# Patient Record
Sex: Male | Born: 1995 | Race: White | Hispanic: No | Marital: Single | Smoking: Current every day smoker
Health system: Southern US, Community
[De-identification: ages and names within clinical notes are randomized; demographics above are authoritative.]

## PROBLEM LIST (undated history)

## (undated) DIAGNOSIS — J984 Other disorders of lung: Secondary | ICD-10-CM

## (undated) DIAGNOSIS — Q249 Congenital malformation of heart, unspecified: Secondary | ICD-10-CM

## (undated) HISTORY — PX: ADENOIDECTOMY: SUR15

## (undated) HISTORY — PX: OTHER SURGICAL HISTORY: SHX169

## (undated) HISTORY — PX: TONSILLECTOMY: SUR1361

---

## 2016-02-27 ENCOUNTER — Encounter (HOSPITAL_COMMUNITY): Payer: Self-pay | Admitting: Emergency Medicine

## 2016-02-27 ENCOUNTER — Emergency Department (HOSPITAL_COMMUNITY)
Admission: EM | Admit: 2016-02-27 | Discharge: 2016-02-27 | Disposition: A | Discharge status: Home / Self Care | Payer: Medicaid Other | Attending: Emergency Medicine | Admitting: Emergency Medicine

## 2016-02-27 ENCOUNTER — Emergency Department (HOSPITAL_COMMUNITY): Payer: Medicaid Other

## 2016-02-27 DIAGNOSIS — R112 Nausea with vomiting, unspecified: Secondary | ICD-10-CM | POA: Insufficient documentation

## 2016-02-27 DIAGNOSIS — R1010 Upper abdominal pain, unspecified: Secondary | ICD-10-CM | POA: Diagnosis present

## 2016-02-27 DIAGNOSIS — R197 Diarrhea, unspecified: Secondary | ICD-10-CM | POA: Diagnosis not present

## 2016-02-27 DIAGNOSIS — R1013 Epigastric pain: Secondary | ICD-10-CM | POA: Insufficient documentation

## 2016-02-27 DIAGNOSIS — Z87891 Personal history of nicotine dependence: Secondary | ICD-10-CM | POA: Insufficient documentation

## 2016-02-27 LAB — CBC WITH DIFFERENTIAL/PLATELET
BASOS PCT: 0 %
Basophils Absolute: 0 10*3/uL (ref 0.0–0.1)
EOS ABS: 0 10*3/uL (ref 0.0–0.7)
Eosinophils Relative: 0 %
HCT: 46 % (ref 39.0–52.0)
Hemoglobin: 16 g/dL (ref 13.0–17.0)
Lymphocytes Relative: 6 %
Lymphs Abs: 1 10*3/uL (ref 0.7–4.0)
MCH: 29.7 pg (ref 26.0–34.0)
MCHC: 34.8 g/dL (ref 30.0–36.0)
MCV: 85.5 fL (ref 78.0–100.0)
MONO ABS: 0.7 10*3/uL (ref 0.1–1.0)
MONOS PCT: 4 %
NEUTROS PCT: 90 %
Neutro Abs: 14.4 10*3/uL — ABNORMAL HIGH (ref 1.7–7.7)
Platelets: 277 10*3/uL (ref 150–400)
RBC: 5.38 MIL/uL (ref 4.22–5.81)
RDW: 13.2 % (ref 11.5–15.5)
WBC: 16.2 10*3/uL — ABNORMAL HIGH (ref 4.0–10.5)

## 2016-02-27 LAB — URINALYSIS, ROUTINE W REFLEX MICROSCOPIC
BILIRUBIN URINE: NEGATIVE
Glucose, UA: NEGATIVE mg/dL
HGB URINE DIPSTICK: NEGATIVE
Ketones, ur: >80 mg/dL — AB
Leukocytes, UA: NEGATIVE
Nitrite: NEGATIVE
PROTEIN: 100 mg/dL — AB
SPECIFIC GRAVITY, URINE: 1.01 (ref 1.005–1.030)
pH: 8.5 — ABNORMAL HIGH (ref 5.0–8.0)

## 2016-02-27 LAB — BASIC METABOLIC PANEL
Anion gap: 10 (ref 5–15)
BUN: 15 mg/dL (ref 6–20)
CALCIUM: 9.8 mg/dL (ref 8.9–10.3)
CO2: 28 mmol/L (ref 22–32)
CREATININE: 0.93 mg/dL (ref 0.61–1.24)
Chloride: 99 mmol/L — ABNORMAL LOW (ref 101–111)
Glucose, Bld: 121 mg/dL — ABNORMAL HIGH (ref 65–99)
Potassium: 3.3 mmol/L — ABNORMAL LOW (ref 3.5–5.1)
SODIUM: 137 mmol/L (ref 135–145)

## 2016-02-27 LAB — LIPASE, BLOOD: LIPASE: 20 U/L (ref 11–51)

## 2016-02-27 LAB — HEPATIC FUNCTION PANEL
ALK PHOS: 105 U/L (ref 38–126)
ALT: 27 U/L (ref 17–63)
AST: 30 U/L (ref 15–41)
Albumin: 5.3 g/dL — ABNORMAL HIGH (ref 3.5–5.0)
BILIRUBIN INDIRECT: 1.1 mg/dL — AB (ref 0.3–0.9)
Bilirubin, Direct: 0.2 mg/dL (ref 0.1–0.5)
TOTAL PROTEIN: 8 g/dL (ref 6.5–8.1)
Total Bilirubin: 1.3 mg/dL — ABNORMAL HIGH (ref 0.3–1.2)

## 2016-02-27 LAB — URINE MICROSCOPIC-ADD ON: RBC / HPF: NONE SEEN RBC/hpf (ref 0–5)

## 2016-02-27 MED ORDER — ONDANSETRON HCL 4 MG PO TABS: 4.0000 mg | ORAL_TABLET | Freq: Three times a day (TID) | ORAL | 0 refills | Status: DC | PRN

## 2016-02-27 MED ORDER — FAMOTIDINE IN NACL 20-0.9 MG/50ML-% IV SOLN
20.0000 mg | Freq: Once | INTRAVENOUS | Status: AC
  Administered 2016-02-27: 20 mg via INTRAVENOUS
  Filled 2016-02-27: qty 50

## 2016-02-27 MED ORDER — ONDANSETRON HCL 4 MG/2ML IJ SOLN
4.0000 mg | INTRAMUSCULAR | Status: DC | PRN
  Administered 2016-02-27: 4 mg via INTRAVENOUS
  Filled 2016-02-27: qty 2

## 2016-02-27 MED ORDER — GI COCKTAIL ~~LOC~~
30.0000 mL | Freq: Once | ORAL | Status: AC
  Administered 2016-02-27: 30 mL via ORAL
  Filled 2016-02-27: qty 30

## 2016-02-27 MED ORDER — MORPHINE SULFATE (PF) 4 MG/ML IV SOLN
4.0000 mg | INTRAVENOUS | Status: DC | PRN
  Administered 2016-02-27: 4 mg via INTRAVENOUS
  Filled 2016-02-27: qty 1

## 2016-02-27 NOTE — Discharge Instructions (Signed)
Eat a bland diet, avoiding greasy, fatty, fried foods, as well as spicy and acidic foods or beverages.  Avoid eating within the hour or 2 before going to bed or laying down.  Also avoid teas, colas, coffee, chocolate, pepermint and spearment.  Take over the counter pepcid, one tablet by mouth twice a day, for the next 3 to 4 weeks.  May also take over the counter maalox/mylanta, as directed on packaging, as needed for discomfort.  Take the prescription as directed.  Call your regular medical doctor tomorrow to schedule a follow up appointment this week.  Call the GI doctor tomorrow to schedule a follow up appointment within the next week. Return to the Emergency Department immediately if worsening.

## 2016-02-27 NOTE — ED Provider Notes (Signed)
[facility]-EMERGENCY DEPT Provider Note   CSN: [id number] Arrival date & time: 02/27/16  1254     History   Chief Complaint Chief Complaint  Patient presents with  . Abdominal Pain    HPI Jason Boyer is a 20 y.o. male.  HPI Pt was seen at 1405.  Per pt, c/o gradual onset and persistence of constant upper abd "pain" for the past 2 days, worse since last night.  Has been associated with multiple intermittent episodes of N/V/D.  Describes the abd pain as "sharp."  States his symptoms worsened last night several hours after he laid down after he ate "pizza rolls and tater tots." Denies fevers, no back pain, no rash, no CP/SOB, no black or blood in stools or emesis.       History reviewed. No pertinent past medical history.  There are no active problems to display for this patient.   Past Surgical History:  Procedure Laterality Date  . ADENOIDECTOMY    . TONSILLECTOMY    . tubes in ears         Home Medications    Prior to Admission medications   Not on File    Family History History reviewed. No pertinent family history.  Social History Social History  Substance Use Topics  . Smoking status: Former Smoker    Types: E-cigarettes  . Smokeless tobacco: Never Used  . Alcohol use No     Allergies   Review of patient's allergies indicates no known allergies.   Review of Systems Review of Systems ROS: Statement: All systems negative except as marked or noted in the HPI; Constitutional: Negative for fever and chills. ; ; Eyes: Negative for eye pain, redness and discharge. ; ; ENMT: Negative for ear pain, hoarseness, nasal congestion, sinus pressure and sore throat. ; ; Cardiovascular: Negative for chest pain, palpitations, diaphoresis, dyspnea and peripheral edema. ; ; Respiratory: Negative for cough, wheezing and stridor. ; ; Gastrointestinal: +N/V/D, abd pain. Negative for blood in stool, hematemesis, jaundice and rectal bleeding. . ; ; Genitourinary: Negative for  dysuria, flank pain and hematuria. ; ; Musculoskeletal: Negative for back pain and neck pain. Negative for swelling and trauma.; ; Skin: Negative for pruritus, rash, abrasions, blisters, bruising and skin lesion.; ; Neuro: Negative for headache, lightheadedness and neck stiffness. Negative for weakness, altered level of consciousness, altered mental status, extremity weakness, paresthesias, involuntary movement, seizure and syncope.       Physical Exam Updated Vital Signs BP 127/68 (BP Location: Right Arm)   Pulse (!) 59   Temp 97.6 F (36.4 C) (Oral)   Resp 16   Ht 6' (1.829 m)   Wt 163 lb (73.9 kg)   SpO2 100%   BMI 22.11 kg/m   Physical Exam 1410: Physical examination:  Nursing notes reviewed; Vital signs and O2 SAT reviewed;  Constitutional: Well developed, Well nourished, Well hydrated, Uncomfortable appearing, crying; Head:  Normocephalic, atraumatic; Eyes: EOMI, PERRL, No scleral icterus; ENMT: Mouth and pharynx normal, Mucous membranes moist; Neck: Supple, Full range of motion, No lymphadenopathy; Cardiovascular: Regular rate and rhythm, No murmur, rub, or gallop; Respiratory: Breath sounds clear & equal bilaterally, No rales, rhonchi, wheezes.  Speaking full sentences with ease, Normal respiratory effort/excursion; Chest: Nontender, Movement normal; Abdomen: Soft, +mid-epigastric tenderness to palp. No rebound or guarding. Nondistended, Normal bowel sounds; Genitourinary: No CVA tenderness; Extremities: Pulses normal, No tenderness, No edema, No calf edema or asymmetry.; Neuro: AA&Ox3, Major CN grossly intact.  Speech clear. No gross focal motor or  sensory deficits in extremities.; Skin: Color normal, Warm, Dry.   ED Treatments / Results  Labs (all labs ordered are listed, but only abnormal results are displayed)   EKG  EKG Interpretation None       Radiology   Procedures Procedures (including critical care time)  Medications Ordered in ED Medications  ondansetron  (ZOFRAN) injection 4 mg (4 mg Intravenous Given 02/27/16 1415)  morphine 4 MG/ML injection 4 mg (4 mg Intravenous Given 02/27/16 1415)  famotidine (PEPCID) IVPB 20 mg premix (20 mg Intravenous New Bag/Given 02/27/16 1415)     Initial Impression / Assessment and Plan / ED Course  I have reviewed the triage vital signs and the nursing notes.  Pertinent labs & imaging results that were available during my care of the patient were reviewed by me and considered in my medical decision making (see chart for details).  MDM Reviewed: previous chart, nursing note and vitals Reviewed previous: labs Interpretation: labs, x-ray and ultrasound   Results for orders placed or performed during the hospital encounter of 02/27/16  CBC with Differential  Result Value Ref Range   WBC 16.2 (H) 4.0 - 10.5 K/uL   RBC 5.38 4.22 - 5.81 MIL/uL   Hemoglobin 16.0 13.0 - 17.0 g/dL   HCT 16.146.0 09.639.0 - 04.552.0 %   MCV 85.5 78.0 - 100.0 fL   MCH 29.7 26.0 - 34.0 pg   MCHC 34.8 30.0 - 36.0 g/dL   RDW 40.913.2 81.111.5 - 91.415.5 %   Platelets 277 150 - 400 K/uL   Neutrophils Relative % 90 %   Neutro Abs 14.4 (H) 1.7 - 7.7 K/uL   Lymphocytes Relative 6 %   Lymphs Abs 1.0 0.7 - 4.0 K/uL   Monocytes Relative 4 %   Monocytes Absolute 0.7 0.1 - 1.0 K/uL   Eosinophils Relative 0 %   Eosinophils Absolute 0.0 0.0 - 0.7 K/uL   Basophils Relative 0 %   Basophils Absolute 0.0 0.0 - 0.1 K/uL  Basic metabolic panel  Result Value Ref Range   Sodium 137 135 - 145 mmol/L   Potassium 3.3 (L) 3.5 - 5.1 mmol/L   Chloride 99 (L) 101 - 111 mmol/L   CO2 28 22 - 32 mmol/L   Glucose, Bld 121 (H) 65 - 99 mg/dL   BUN 15 6 - 20 mg/dL   Creatinine, Ser 7.820.93 0.61 - 1.24 mg/dL   Calcium 9.8 8.9 - 95.610.3 mg/dL   GFR calc non Af Amer >60 >60 mL/min   GFR calc Af Amer >60 >60 mL/min   Anion gap 10 5 - 15  Lipase, blood  Result Value Ref Range   Lipase 20 11 - 51 U/L  Urinalysis, Routine w reflex microscopic ([facility])  Result Value Ref Range     Color, Urine YELLOW YELLOW   APPearance CLEAR CLEAR   Specific Gravity, Urine 1.010 1.005 - 1.030   pH 8.5 (H) 5.0 - 8.0   Glucose, UA NEGATIVE NEGATIVE mg/dL   Hgb urine dipstick NEGATIVE NEGATIVE   Bilirubin Urine NEGATIVE NEGATIVE   Ketones, ur >80 (A) NEGATIVE mg/dL   Protein, ur 213100 (A) NEGATIVE mg/dL   Nitrite NEGATIVE NEGATIVE   Leukocytes, UA NEGATIVE NEGATIVE  Hepatic function panel  Result Value Ref Range   Total Protein 8.0 6.5 - 8.1 g/dL   Albumin 5.3 (H) 3.5 - 5.0 g/dL   AST 30 15 - 41 U/L   ALT 27 17 - 63 U/L   Alkaline Phosphatase  105 38 - 126 U/L   Total Bilirubin 1.3 (H) 0.3 - 1.2 mg/dL   Bilirubin, Direct 0.2 0.1 - 0.5 mg/dL   Indirect Bilirubin 1.1 (H) 0.3 - 0.9 mg/dL  Urine microscopic-add on  Result Value Ref Range   Squamous Epithelial / LPF 0-5 (A) NONE SEEN   WBC, UA 0-5 0 - 5 WBC/hpf   RBC / HPF NONE SEEN 0 - 5 RBC/hpf   Bacteria, UA RARE (A) NONE SEEN   US Abdomen Complete Result Date: 02/27/2016 CLINICAL DATA:  Abdominal pain EXAM: ABDOMEN ULTRASOUND COMPLETE COMPARISON:  None. FINDINGS: Gallbladder: No gallstones are noted within gallbladder. Borderline thickening of gallbladder wall up to 3 mm. Gallbladder sludge is noted. No sonographic Murphy's sign. Common bile duct: Diameter: 3 mm in diameter within normal limits. Liver: No focal lesion identified. Within normal limits in parenchymal echogenicity. IVC: No abnormality visualized. Pancreas: Visualized portion unremarkable. Main pancreatic duct measures 2 mm in diameter. Spleen: Size and appearance within normal limits. Measures 6.4 cm in length Right Kidney: Length: 11 cm. Echogenicity within normal limits. No mass or hydronephrosis visualized. Left Kidney: Length: 12 cm. Echogenicity within normal limits. No mass or hydronephrosis visualized. Abdominal aorta: No aneurysm visualized. Measures up to 2.2 cm in diameter. Other findings: The patient presented epigastric and right upper quadrant pain  throughout the examination IMPRESSION: 1. No shadowing gallstones are noted within gallbladder. Small gallbladder sludge. Borderline thickening of gallbladder wall up to 3 mm. No sonographic Murphy's sign. 2. Normal CBD. 3. No hydronephrosis. 4. No aortic aneurysm. Electronically Signed   By: Natasha Mead M.D.   On: 02/27/2016 15:36   Dg Abd Acute W/chest Result Date: 02/27/2016 CLINICAL DATA:  Abdominal pain. EXAM: DG ABDOMEN ACUTE W/ 1V CHEST COMPARISON:  No recent prior. FINDINGS: Mediastinum hilar structures normal. Tiny linear density in the right upper lung most likely a prominent small vessel or focal area of atelectasis and/or scarring. Lungs are clear of acute infiltrates. Heart size normal. No pleural effusion or pneumothorax. Soft tissue structures are unremarkable. No bowel distention or free air. IMPRESSION: No acute cardiopulmonary disease. No acute intra-abdominal abnormality. Electronically Signed   By: Maisie Fus  Register   On: 02/27/2016 14:39    1650:  Pt has tol PO well while in the ED without N/V.  No stooling while in the ED.  Abd benign, VSS. Feels better and wants to go home now. Tx symptomatically at this time. Dx and testing d/w pt.  Questions answered.  Verb understanding, agreeable to d/c home with outpt f/u.    Final Clinical Impressions(s) / ED Diagnoses   Final diagnoses:  None    New Prescriptions New Prescriptions   No medications on file     Samuel Jester, DO 03/01/16 [phone]

## 2016-02-27 NOTE — ED Triage Notes (Signed)
Patient complaining of upper middle abdominal pain x 2 days. Also complaining of vomiting starting last night. States he also had hematemesis.

## 2017-08-10 ENCOUNTER — Encounter (HOSPITAL_COMMUNITY): Payer: Self-pay | Admitting: Emergency Medicine

## 2017-08-10 ENCOUNTER — Emergency Department (HOSPITAL_COMMUNITY): Payer: Medicaid Other

## 2017-08-10 ENCOUNTER — Other Ambulatory Visit: Payer: Self-pay

## 2017-08-10 DIAGNOSIS — S67192A Crushing injury of right middle finger, initial encounter: Secondary | ICD-10-CM | POA: Diagnosis present

## 2017-08-10 DIAGNOSIS — F17210 Nicotine dependence, cigarettes, uncomplicated: Secondary | ICD-10-CM | POA: Insufficient documentation

## 2017-08-10 DIAGNOSIS — Y999 Unspecified external cause status: Secondary | ICD-10-CM | POA: Insufficient documentation

## 2017-08-10 DIAGNOSIS — S60131A Contusion of right middle finger with damage to nail, initial encounter: Secondary | ICD-10-CM | POA: Diagnosis not present

## 2017-08-10 DIAGNOSIS — W230XXA Caught, crushed, jammed, or pinched between moving objects, initial encounter: Secondary | ICD-10-CM | POA: Insufficient documentation

## 2017-08-10 DIAGNOSIS — Y929 Unspecified place or not applicable: Secondary | ICD-10-CM | POA: Insufficient documentation

## 2017-08-10 DIAGNOSIS — Z23 Encounter for immunization: Secondary | ICD-10-CM | POA: Diagnosis not present

## 2017-08-10 DIAGNOSIS — Y9389 Activity, other specified: Secondary | ICD-10-CM | POA: Insufficient documentation

## 2017-08-10 DIAGNOSIS — S61214A Laceration without foreign body of right ring finger without damage to nail, initial encounter: Secondary | ICD-10-CM | POA: Diagnosis not present

## 2017-08-10 NOTE — ED Triage Notes (Signed)
Middle and ring finger on rt hand got stuck in some gears at work

## 2017-08-11 ENCOUNTER — Emergency Department (HOSPITAL_COMMUNITY)
Admission: EM | Admit: 2017-08-11 | Discharge: 2017-08-11 | Disposition: A | Discharge status: Home / Self Care | Payer: Medicaid Other | Attending: Emergency Medicine | Admitting: Emergency Medicine

## 2017-08-11 DIAGNOSIS — S61214A Laceration without foreign body of right ring finger without damage to nail, initial encounter: Secondary | ICD-10-CM

## 2017-08-11 DIAGNOSIS — S6010XA Contusion of unspecified finger with damage to nail, initial encounter: Secondary | ICD-10-CM

## 2017-08-11 MED ORDER — IBUPROFEN 800 MG PO TABS: 800.0000 mg | ORAL_TABLET | Freq: Three times a day (TID) | ORAL | 0 refills | Status: AC

## 2017-08-11 MED ORDER — TETANUS-DIPHTH-ACELL PERTUSSIS 5-2.5-18.5 LF-MCG/0.5 IM SUSP
0.5000 mL | Freq: Once | INTRAMUSCULAR | Status: AC
  Administered 2017-08-11: 0.5 mL via INTRAMUSCULAR
  Filled 2017-08-11: qty 0.5

## 2017-08-11 MED ORDER — POVIDONE-IODINE 10 % EX SOLN
CUTANEOUS | Status: AC
  Administered 2017-08-11: 02:00:00
  Filled 2017-08-11: qty 15

## 2017-08-11 MED ORDER — POVIDONE-IODINE 10 % EX OINT
TOPICAL_OINTMENT | Freq: Once | CUTANEOUS | Status: DC
  Filled 2017-08-11: qty 28.35

## 2017-08-11 MED ORDER — SULFAMETHOXAZOLE-TRIMETHOPRIM 800-160 MG PO TABS
1.0000 | ORAL_TABLET | Freq: Once | ORAL | Status: AC
  Administered 2017-08-11: 1 via ORAL
  Filled 2017-08-11: qty 1

## 2017-08-11 MED ORDER — SULFAMETHOXAZOLE-TRIMETHOPRIM 800-160 MG PO TABS: 1.0000 | ORAL_TABLET | Freq: Two times a day (BID) | ORAL | 0 refills | Status: AC

## 2017-08-11 NOTE — ED Provider Notes (Signed)
[facility] EMERGENCY DEPARTMENT Provider Note   CSN: [id number] Arrival date & time: 08/10/17  2241     History   Chief Complaint Chief Complaint  Patient presents with  . Finger Injury    HPI Jason Boyer is a 22 y.o. male.  HPI   Jason Boyer is a 22 y.o. male who presents to the Emergency Department complaining of crush injury to the distal ends of his right middle and ring fingers.  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  He complains of tingling to his fingertips and throbbing pain of the distal end of the middle finger discoloration of the nail with laceration to the ring finger and abrasion fo the middle finger.  He has not taken any medications or tried any therapies prior to arrival.  Last tetanus is unknown.  He denies any pain to the proximal hand or wrist.   History reviewed. No pertinent past medical history.  There are no active problems to display for this patient.   Past Surgical History:  Procedure Laterality Date  . ADENOIDECTOMY    . TONSILLECTOMY    . tubes in ears          Home Medications    Prior to Admission medications   Medication Sig Start Date End Date Taking? Authorizing Provider  ondansetron (ZOFRAN) 4 MG tablet Take 1 tablet (4 mg total) by mouth every 8 (eight) hours as needed for nausea or vomiting. 02/27/16   Samuel Jester, DO  RaNITidine HCl (ZANTAC PO) Take 1 tablet by mouth once as needed (for heartburn/GERD symptoms).    [provider]    Family History No family history on file.  Social History Social History   Tobacco Use  . Smoking status: Current Every Day Smoker    Packs/day: 1.00    Types: Cigarettes  . Smokeless tobacco: Never Used  Substance Use Topics  . Alcohol use: No  . Drug use: Yes    Types: Marijuana     Allergies   Patient has no known allergies.   Review of Systems Review of Systems  Constitutional: Negative for chills and fever.   Musculoskeletal: Positive for arthralgias. Negative for joint swelling.  Skin: Positive for wound (abrasion right middle and laceration of ring finger). Negative for color change.  Neurological: Negative for weakness and numbness.  All other systems reviewed and are negative.    Physical Exam Updated Vital Signs BP (!) 149/92 (BP Location: Right Arm)   Pulse 79   Temp 98 F (36.7 C) (Oral)   Resp 18   Ht 6\' 2"  (1.88 m)   Wt 77.1 kg (170 lb)   SpO2 100%   BMI 21.83 kg/m   Physical Exam  Constitutional: He is oriented to person, place, and time. He appears well-developed and well-nourished. No distress.  HENT:  Head: Atraumatic.  Cardiovascular: Normal rate and intact distal pulses.  Pulmonary/Chest: Effort normal and breath sounds normal. No respiratory distress.  Musculoskeletal: Normal range of motion. He exhibits no edema or deformity.  Pt has full ROM of the PIP and DIP joint's of the right middle and ring fingers.  Small subungual hematoma right middle fingernail, mild bleeding along the epionychium  Neurological: He is alert and oriented to person, place, and time. No sensory deficit.  Skin: Skin is warm. Capillary refill takes less than 2 seconds.  Abrasion of the distal right middle finger.  Superficial laceration to the distal  right ring finger.  No edema.    Nursing note and vitals reviewed.    ED Treatments / Results  Labs (all labs ordered are listed, but only abnormal results are displayed) Labs Reviewed - No data to display  EKG None  Radiology Dg Hand Complete Right  Result Date: 08/10/2017 CLINICAL DATA:  Right third and fourth digits got stuck in gears while working on a car. Pain. EXAM: RIGHT HAND - COMPLETE 3+ VIEW COMPARISON:  None. FINDINGS: Tiny 1 x 2 mm ossific density off the radial aspect of the third proximal phalangeal head is suspicious for an age-indeterminate collateral ligament injury. Given mild soft tissue swelling at this level, suspect  this could be acute. No joint dislocation. Soft tissue debris over the dorsum of the fifth PIP joint. Carpal rows are maintained. The distal radius and ulna are unremarkable. Probable vascular phleboliths or soft tissue debris along the volar aspect of the distal forearm. IMPRESSION: Tiny 1 x 2 mm ossific density adjacent to the head of the third proximal phalanx along the radial aspect suspicious for tiny avulsion fracture. Electronically Signed   By: Tollie Ethavid  Kwon M.D.   On: 08/10/2017 23:32    Procedures Procedures (including critical care time)  Medications Ordered in ED Medications  povidone-iodine (BETADINE) 10 % external solution (has no administration in time range)  povidone-iodine (BETADINE) 10 % ointment (has no administration in time range)  Tdap (BOOSTRIX) injection 0.5 mL (has no administration in time range)  sulfamethoxazole-trimethoprim (BACTRIM DS,SEPTRA DS) 800-160 MG per tablet 1 tablet (has no administration in time range)     Initial Impression / Assessment and Plan / ED Course  I have reviewed the triage vital signs and the nursing notes.  Pertinent labs & imaging results that were available during my care of the patient were reviewed by me and considered in my medical decision making (see chart for details).     Patient does not have tenderness or injuries at the site of the third proximal phalanx.  I do not feel that this is clinically significant.  Wounds have been cleaned with saline and Betadine, Td updated.  Will apply finger splint and buddy tape fingers for support.  Laceration appears superficial and does not appear to need sutures.  Patient agrees to treatment plan with antibiotics and close follow-up with local orthopedics.  Wound care instructions and return precautions discussed.  Final Clinical Impressions(s) / ED Diagnoses   Final diagnoses:  Subungual hematoma of digit of hand, initial encounter  Laceration of right ring finger without foreign body  without damage to nail, initial encounter    ED Discharge Orders    None       Pauline Ausriplett, Lucca Greggs, PA-C 08/11/17 0150    Devoria AlbeKnapp, Iva, MD 08/11/17 [phone]

## 2017-08-11 NOTE — Discharge Instructions (Addendum)
Clean the wounds with mild soap and water.  Keep your fingers bandaged and buddy taped for 1 week.  Return to the ER for any signs of infection such as increasing pain, swelling or redness of your fingers fever or chills.  You have a bruise to the fingernail of your middle finger and will likely lose the nail, but will grow another one.  Avoid getting your right hand dirty until the wounds heal.  Call Dr. Mort SawyersHarrison's office to arrange a follow-up appointment in 3-4 days if the symptoms are not improving

## 2017-09-10 IMAGING — DX DG ABDOMEN ACUTE W/ 1V CHEST
3 series · 3 of 3 positions shown · non-contrast
Comparison: No recent prior.

CLINICAL DATA: Abdominal pain.

EXAM:
DG ABDOMEN ACUTE W/ 1V CHEST

[chest pa]
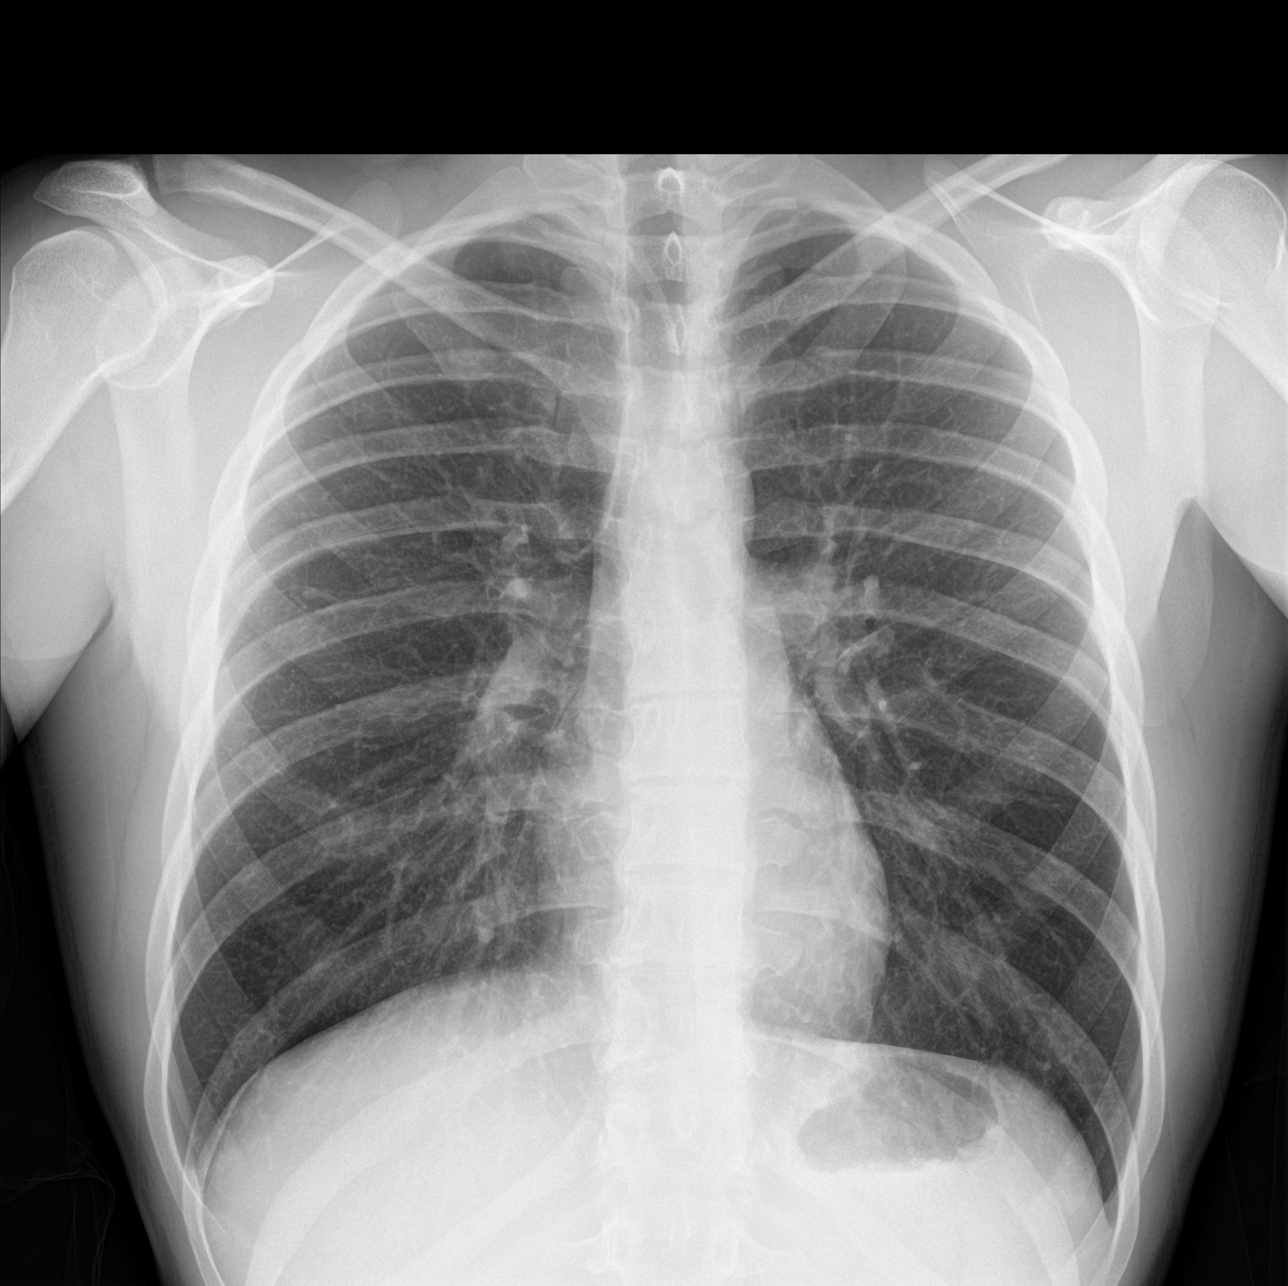

[abdomen erect]
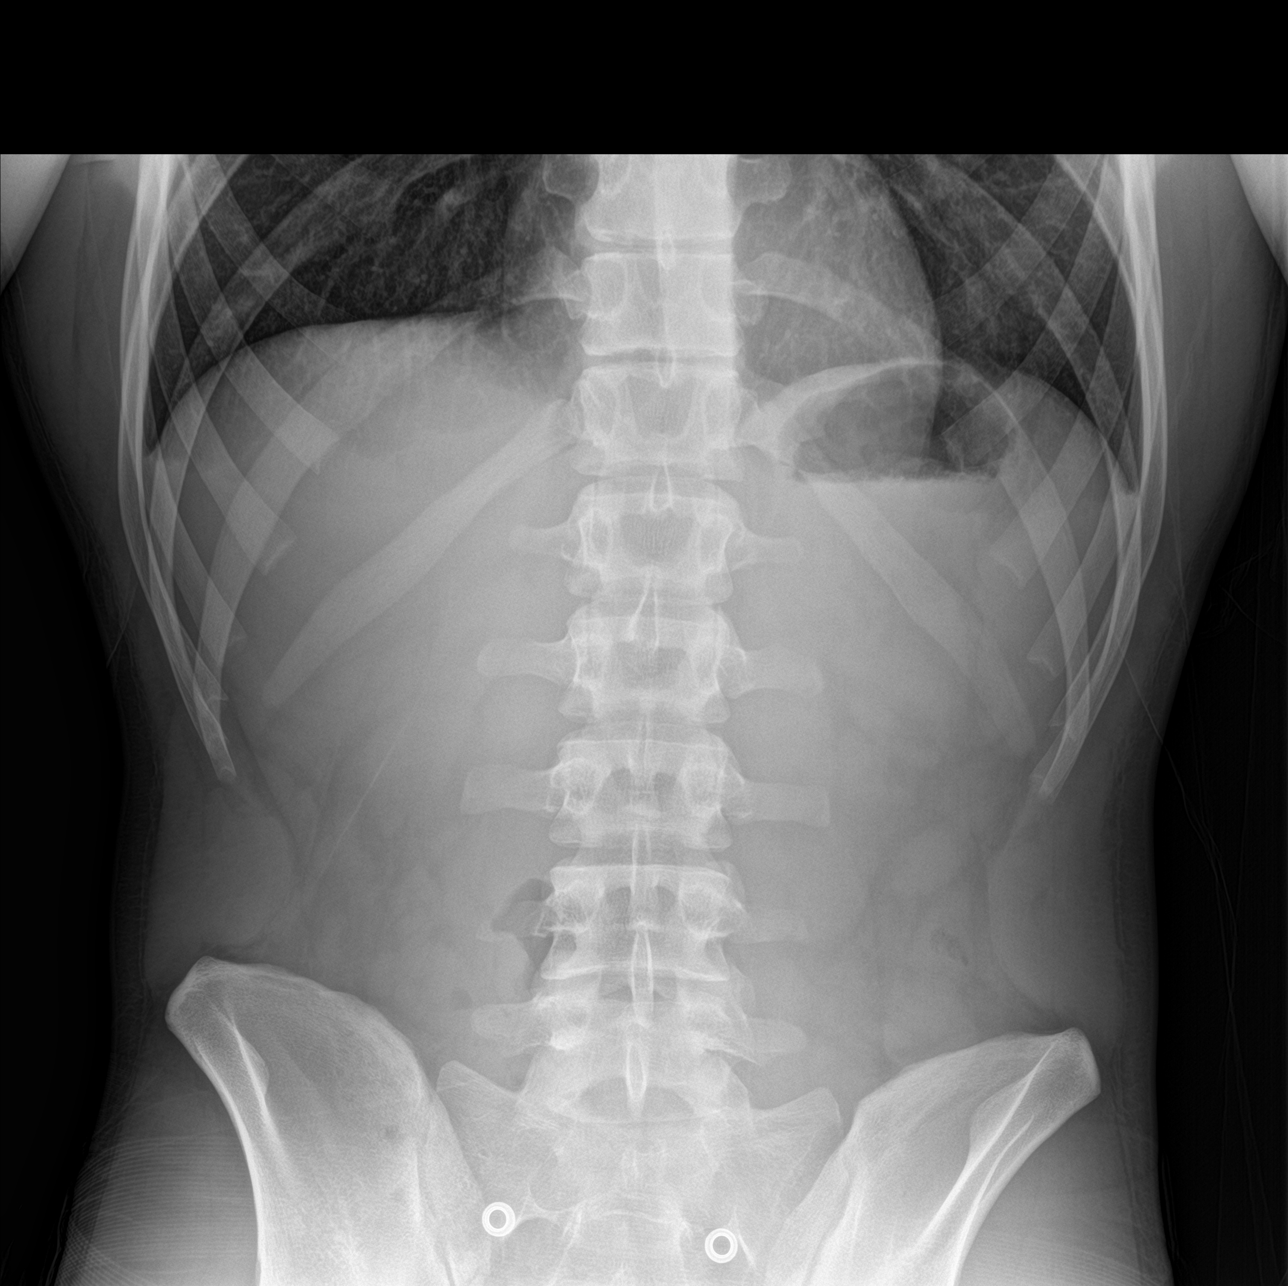

[abdomen supine]
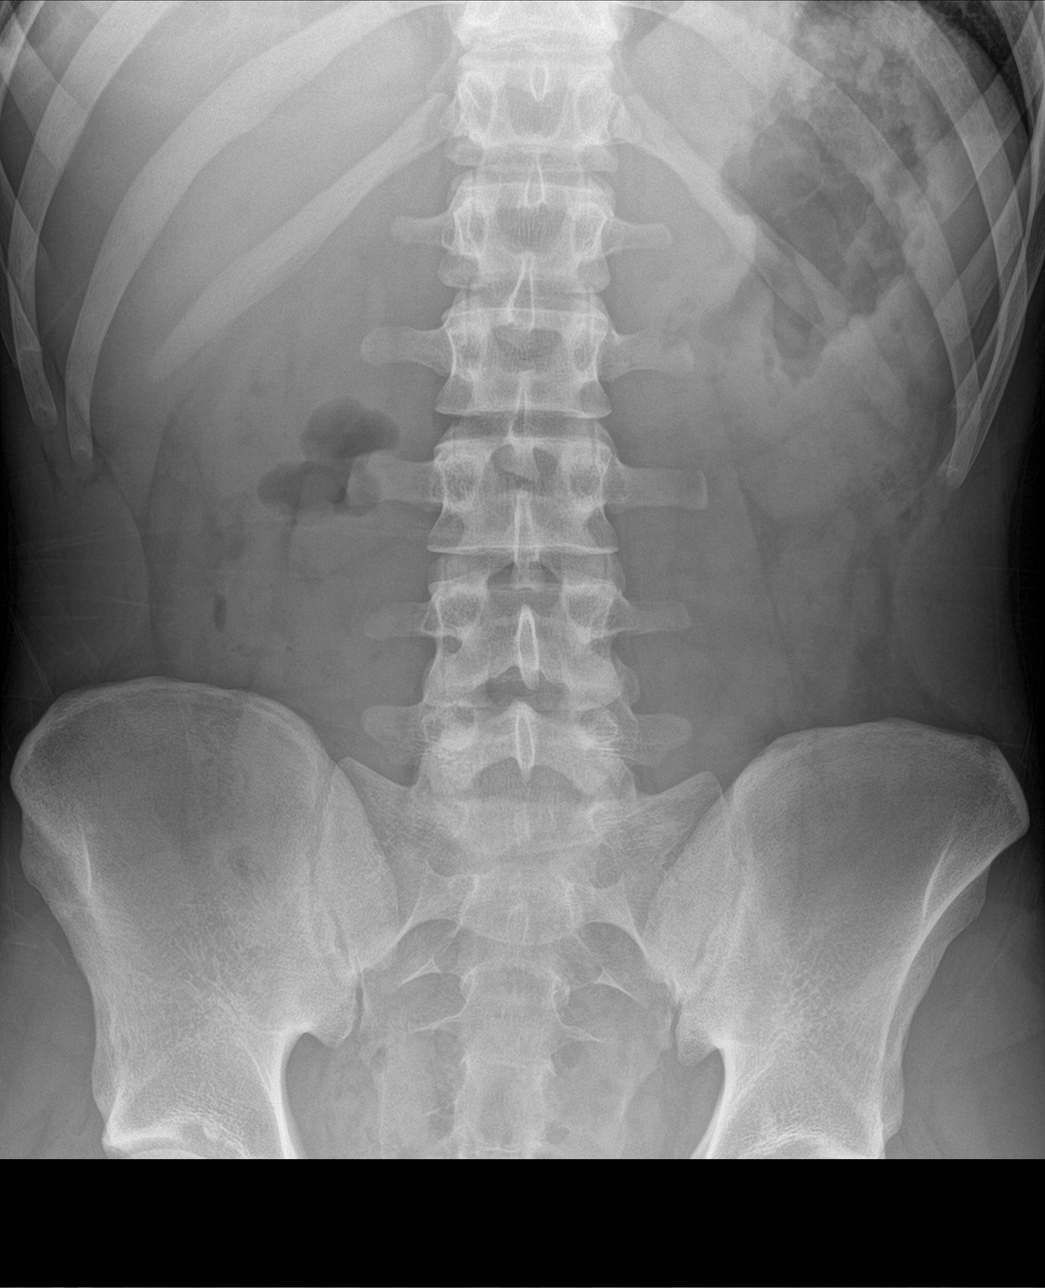

[3 of 3 positions shown; findings below may reference images not displayed]

FINDINGS: Mediastinum hilar structures normal. Tiny linear density in the
right upper lung most likely a prominent small vessel or focal area
of atelectasis and/or scarring. Lungs are clear of acute
infiltrates. Heart size normal. No pleural effusion or pneumothorax.
Soft tissue structures are unremarkable. No bowel distention or free
air.
IMPRESSION: No acute cardiopulmonary disease. No acute intra-abdominal
abnormality.

## 2018-05-09 IMAGING — US US ABDOMEN COMPLETE
1 series · 13 of 25 positions shown · non-contrast
Comparison: None.

CLINICAL DATA: Abdominal pain

EXAM:
ABDOMEN ULTRASOUND COMPLETE

[Series 1: us abdomen complete · 0.16mm/px · 13 of 106 slices shown]
[im 1/106]
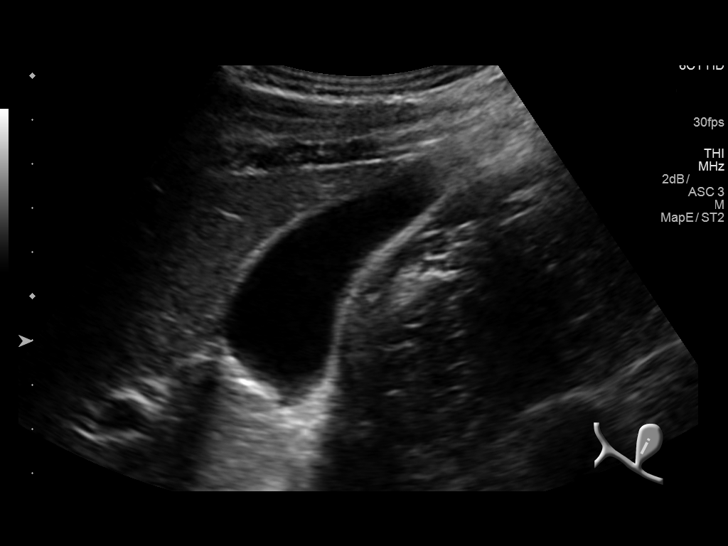
[im 9/106]
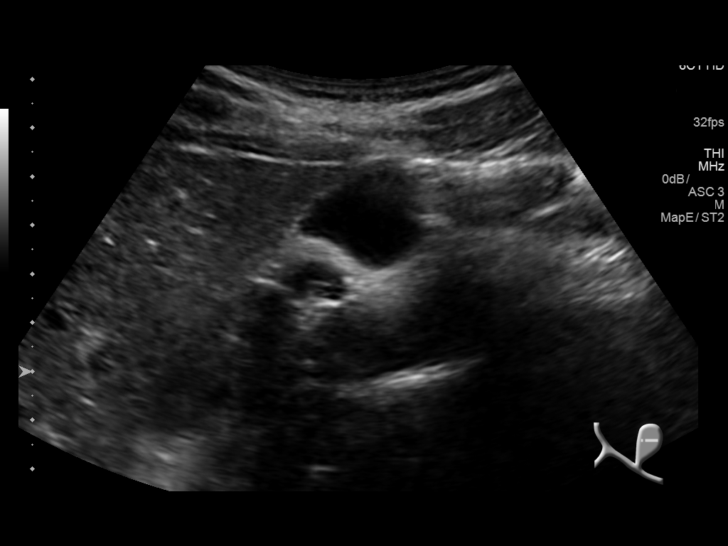
[im 18/106]
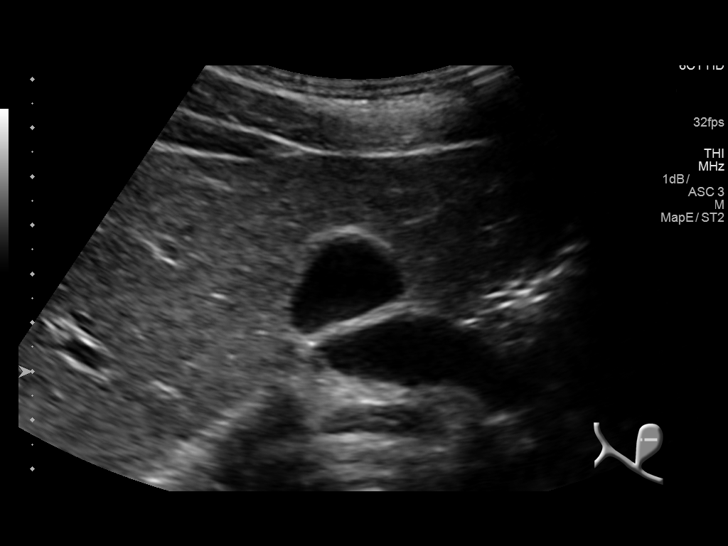
[im 27/106]
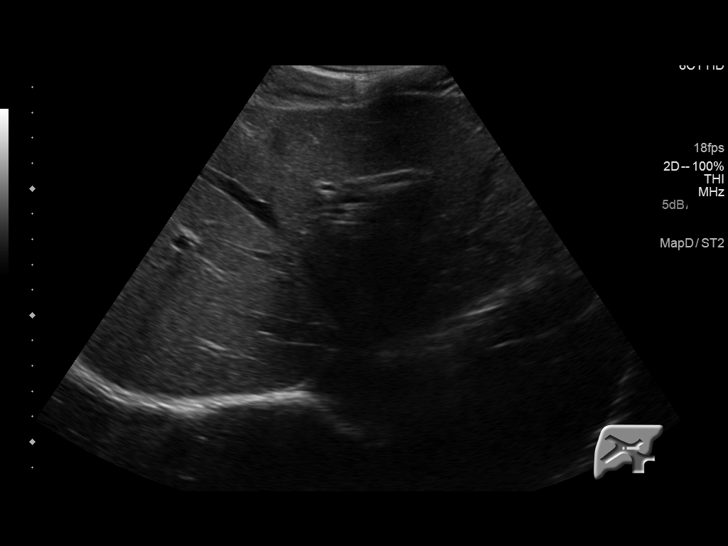
[im 36/106]
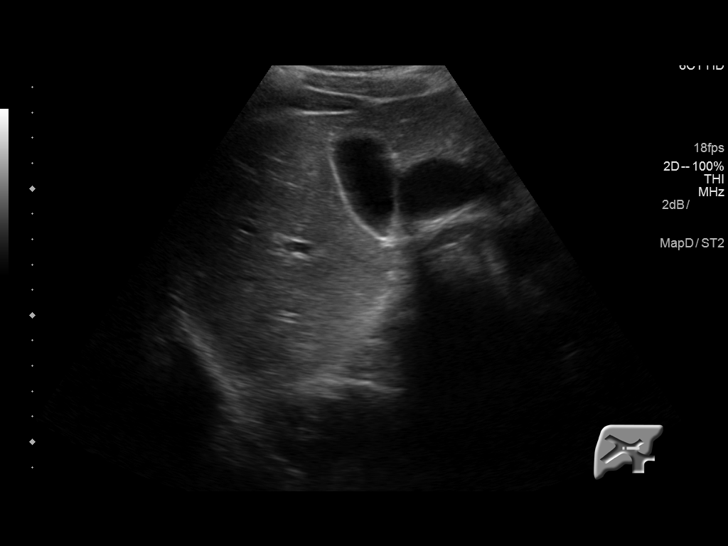
[im 44/106]
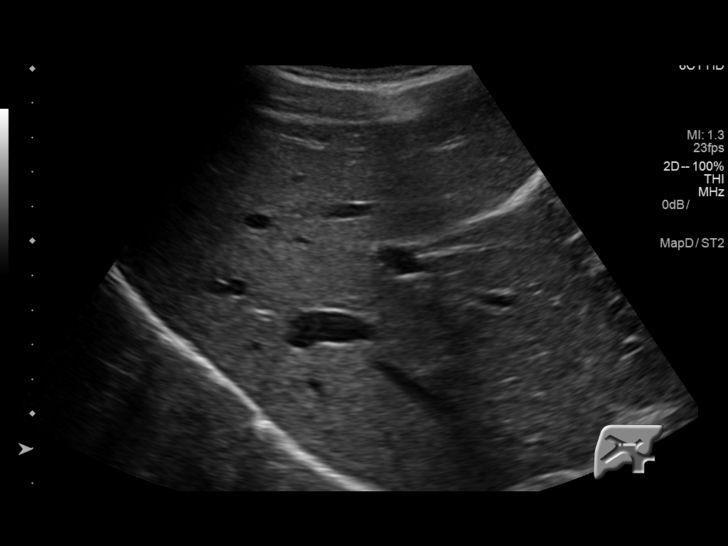
[im 53/106]
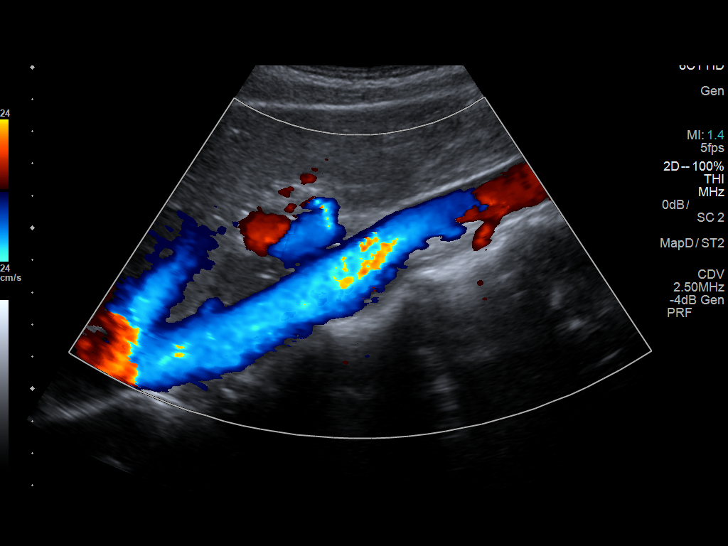
[im 62/106]
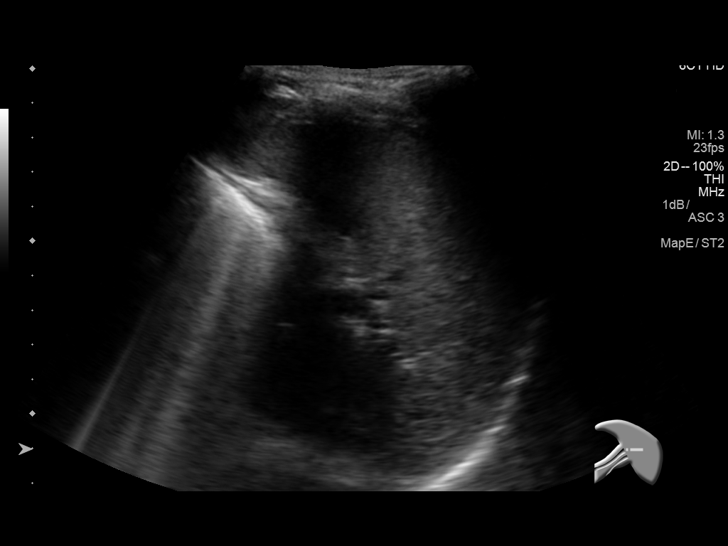
[im 71/106]
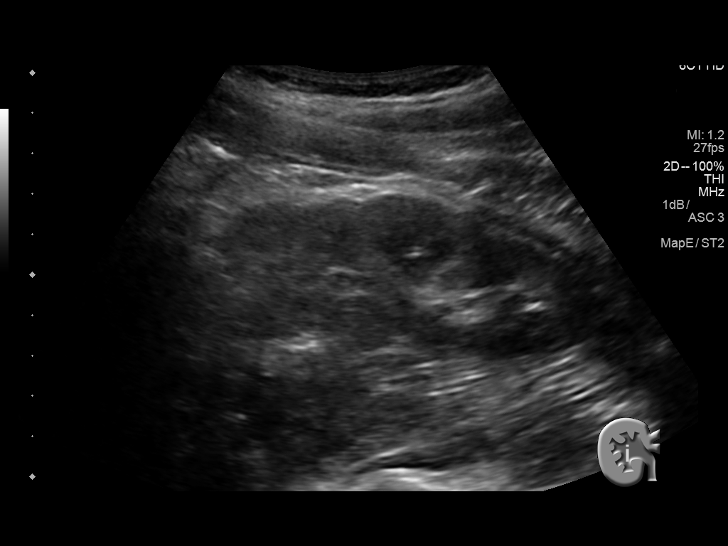
[im 79/106]
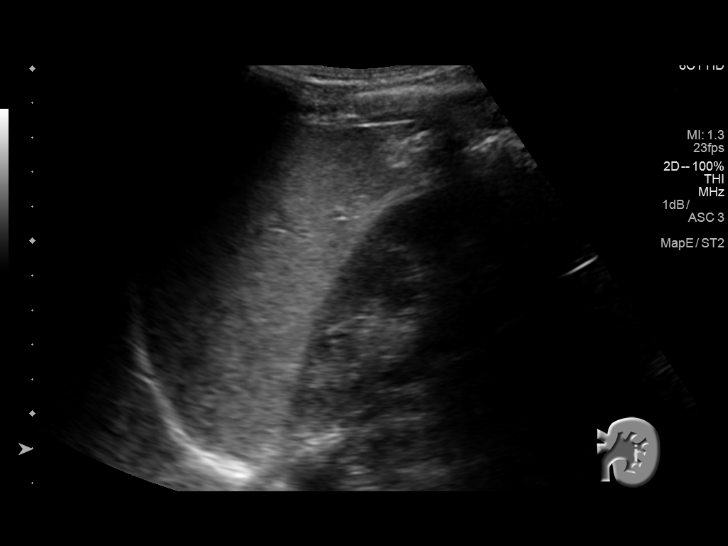
[im 88/106]
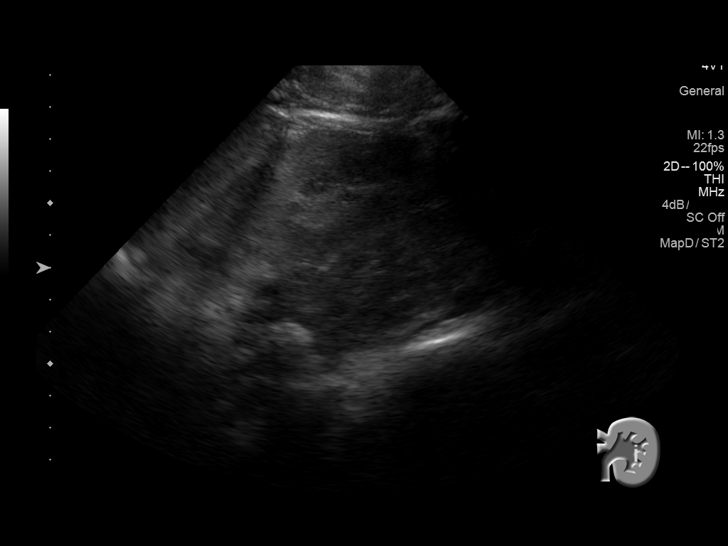
[im 97/106]
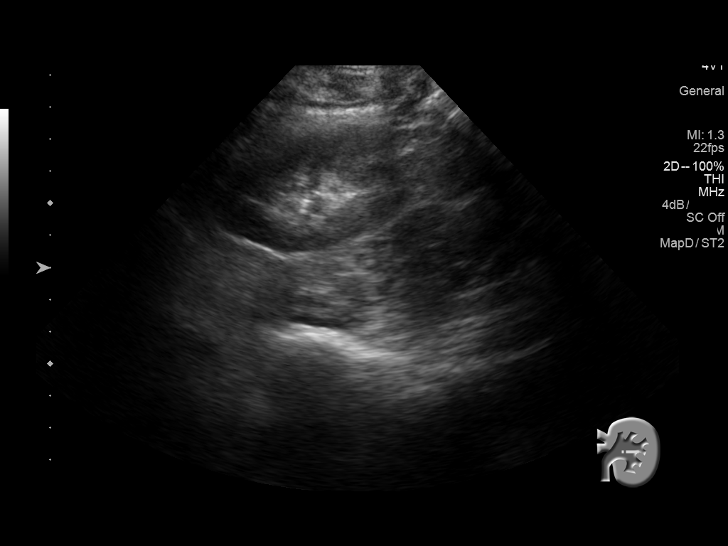
[im 106/106]
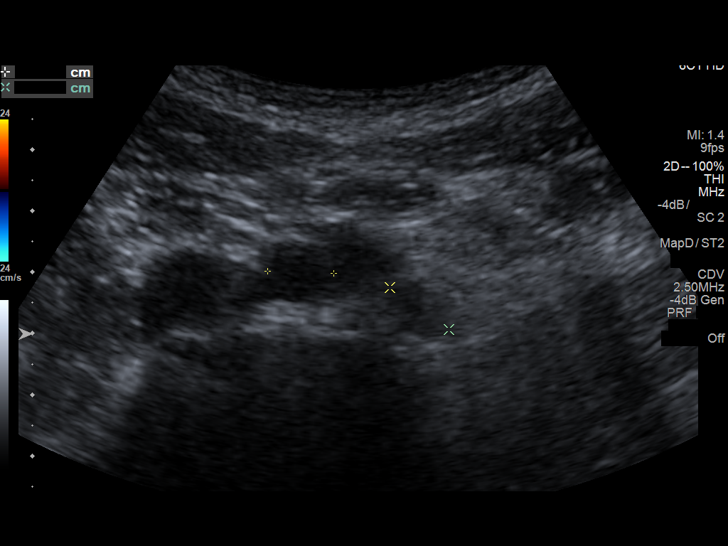

[13 of 25 positions shown; findings below may reference images not displayed]

FINDINGS: Gallbladder: No gallstones are noted within gallbladder. Borderline
thickening of gallbladder wall up to 3 mm. Gallbladder sludge is
noted. No sonographic Murphy's sign.

Common bile duct: Diameter: 3 mm in diameter within normal limits.

Liver: No focal lesion identified. Within normal limits in
parenchymal echogenicity.

IVC: No abnormality visualized.

Pancreas: Visualized portion unremarkable. Main pancreatic duct
measures 2 mm in diameter.

Spleen: Size and appearance within normal limits. Measures 6.4 cm in
length

Right Kidney: Length: 11 cm. Echogenicity within normal limits. No
mass or hydronephrosis visualized.

Left Kidney: Length: 12 cm. Echogenicity within normal limits. No
mass or hydronephrosis visualized.

Abdominal aorta: No aneurysm visualized. Measures up to 2.2 cm in
diameter.

Other findings: The patient presented epigastric and right upper
quadrant pain throughout the examination
IMPRESSION: 1. No shadowing gallstones are noted within gallbladder. Small
gallbladder sludge. Borderline thickening of gallbladder wall up to
3 mm. No sonographic Murphy's sign.
2. Normal CBD.
3. No hydronephrosis.
4. No aortic aneurysm.

## 2018-10-02 ENCOUNTER — Emergency Department (HOSPITAL_COMMUNITY)
Admission: EM | Admit: 2018-10-02 | Discharge: 2018-10-02 | Disposition: A | Discharge status: Home / Self Care | Payer: Self-pay | Attending: Emergency Medicine | Admitting: Emergency Medicine

## 2018-10-02 ENCOUNTER — Other Ambulatory Visit: Payer: Self-pay

## 2018-10-02 ENCOUNTER — Encounter (HOSPITAL_COMMUNITY): Payer: Self-pay | Admitting: Emergency Medicine

## 2018-10-02 DIAGNOSIS — F17210 Nicotine dependence, cigarettes, uncomplicated: Secondary | ICD-10-CM | POA: Insufficient documentation

## 2018-10-02 DIAGNOSIS — R1013 Epigastric pain: Secondary | ICD-10-CM | POA: Insufficient documentation

## 2018-10-02 LAB — COMPREHENSIVE METABOLIC PANEL
ALT: 14 U/L (ref 0–44)
AST: 16 U/L (ref 15–41)
Albumin: 4.7 g/dL (ref 3.5–5.0)
Alkaline Phosphatase: 82 U/L (ref 38–126)
Anion gap: 8 (ref 5–15)
BUN: 11 mg/dL (ref 6–20)
CO2: 25 mmol/L (ref 22–32)
Calcium: 9.4 mg/dL (ref 8.9–10.3)
Chloride: 107 mmol/L (ref 98–111)
Creatinine, Ser: 0.87 mg/dL (ref 0.61–1.24)
GFR calc Af Amer: >60 mL/min (ref >60)
GFR calc non Af Amer: >60 mL/min (ref >60)
Glucose, Bld: 100 mg/dL — ABNORMAL HIGH (ref 70–99)
Potassium: 4.6 mmol/L (ref 3.5–5.1)
Sodium: 140 mmol/L (ref 135–145)
Total Bilirubin: 0.5 mg/dL (ref 0.3–1.2)
Total Protein: 7.4 g/dL (ref 6.5–8.1)

## 2018-10-02 LAB — CBC WITH DIFFERENTIAL/PLATELET
Abs Immature Granulocytes: 0.01 10*3/uL (ref 0.00–0.07)
Basophils Absolute: 0 10*3/uL (ref 0.0–0.1)
Basophils Relative: 1 %
Eosinophils Absolute: 0.5 10*3/uL (ref 0.0–0.5)
Eosinophils Relative: 7 %
HCT: 45.7 % (ref 39.0–52.0)
Hemoglobin: 15.1 g/dL (ref 13.0–17.0)
Immature Granulocytes: 0 %
Lymphocytes Relative: 29 %
Lymphs Abs: 2.4 10*3/uL (ref 0.7–4.0)
MCH: 29.8 pg (ref 26.0–34.0)
MCHC: 33 g/dL (ref 30.0–36.0)
MCV: 90.3 fL (ref 80.0–100.0)
Monocytes Absolute: 0.6 10*3/uL (ref 0.1–1.0)
Monocytes Relative: 7 %
Neutro Abs: 4.7 10*3/uL (ref 1.7–7.7)
Neutrophils Relative %: 56 %
Platelets: 231 10*3/uL (ref 150–400)
RBC: 5.06 MIL/uL (ref 4.22–5.81)
RDW: 13.1 % (ref 11.5–15.5)
WBC: 8.2 10*3/uL (ref 4.0–10.5)
nRBC: 0 % (ref 0.0–0.2)

## 2018-10-02 LAB — LIPASE, BLOOD: Lipase: 44 U/L (ref 11–51)

## 2018-10-02 MED ORDER — FAMOTIDINE 20 MG PO TABS
20.0000 mg | ORAL_TABLET | Freq: Once | ORAL | Status: AC
  Administered 2018-10-02: 20 mg via ORAL
  Filled 2018-10-02: qty 1

## 2018-10-02 MED ORDER — ONDANSETRON HCL 4 MG PO TABS
4.0000 mg | ORAL_TABLET | Freq: Once | ORAL | Status: AC
  Administered 2018-10-02: 4 mg via ORAL
  Filled 2018-10-02: qty 1

## 2018-10-02 MED ORDER — ONDANSETRON HCL 4 MG PO TABS: 4.0000 mg | ORAL_TABLET | Freq: Four times a day (QID) | ORAL | 0 refills | Status: AC

## 2018-10-02 MED ORDER — PANTOPRAZOLE SODIUM 20 MG PO TBEC: 20.0000 mg | DELAYED_RELEASE_TABLET | Freq: Every day | ORAL | 0 refills | Status: AC

## 2018-10-02 MED ORDER — PANTOPRAZOLE SODIUM 40 MG PO TBEC
40.0000 mg | DELAYED_RELEASE_TABLET | Freq: Once | ORAL | Status: AC
  Administered 2018-10-02: 40 mg via ORAL
  Filled 2018-10-02: qty 1

## 2018-10-02 MED ORDER — FAMOTIDINE 20 MG PO TABS: 20.0000 mg | ORAL_TABLET | Freq: Two times a day (BID) | ORAL | 0 refills | Status: AC

## 2018-10-02 NOTE — Discharge Instructions (Addendum)
Your vital signs, complete blood count, chemistries, and lipase test are all negative for acute problem at this time.  Please call Ms. Boone for evaluation and management of your ongoing stomach problem.  Please use Pepcid 20 mg 2 times daily, Protonix 20 mg daily, and Zofran every 6 hours as needed for nausea.

## 2018-10-02 NOTE — ED Triage Notes (Signed)
Patient reports epigastric pain , intermittent with vomiting x 3 years. Has taken H2 blockers without relief. Patient states he came in today because he has been out of work and has to have a doctor's note.

## 2018-10-02 NOTE — ED Provider Notes (Signed)
[facility] EMERGENCY DEPARTMENT Provider Note   CSN: [id number] Arrival date & time: 10/02/18  1310    History   Chief Complaint Chief Complaint  Patient presents with  . Abdominal Pain    HPI Jason Boyer is a 23 y.o. male.     Patient is a 23 year old male who presents to the emergency department with a complaint of epigastric area pain.  The patient states he has been suffering with abdominal pain for the past 2 to 3 years.  He says he has pain almost every other day.  He has vomiting almost every other day.  He says some mornings he wakes up and he is in tears because of the level of his pain.  He says that he has not been able to go to work over the last few days because of his pain.  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  The patient states he had been prescribed antacid medications in the past, but states that these are not helping at all.  He says he is just been trying to deal with that, but cannot deal with it any longer.  Patient denies any vomiting of blood.  No blood in the stools.  He denies any high fever.  No recent injury or trauma to the abdomen.  He has not had any recent changes in his food.  He is not had any changes in medications.  The patient states that he has not been traveling recently.  He has not been in contact with anyone diagnosed with a COVID-19.  The history is provided by the patient.  Abdominal Pain  Associated symptoms: vomiting   Associated symptoms: no chest pain, no cough, no dysuria, no hematuria and no shortness of breath     History reviewed. No pertinent past medical history.  There are no active problems to display for this patient.   Past Surgical History:  Procedure Laterality Date  . ADENOIDECTOMY    . TONSILLECTOMY    . tubes in ears          Home Medications    Prior to Admission medications   Medication Sig Start Date End Date Taking?  Authorizing Provider  RaNITidine HCl (ZANTAC PO) Take 1 tablet by mouth once as needed (for heartburn/GERD symptoms).   Yes [provider]  ibuprofen (ADVIL,MOTRIN) 800 MG tablet Take 1 tablet (800 mg total) by mouth 3 (three) times daily. Patient not taking: Reported on 10/02/2018 08/11/17   Triplett, Tammy, PA-C  ondansetron (ZOFRAN) 4 MG tablet Take 1 tablet (4 mg total) by mouth every 8 (eight) hours as needed for nausea or vomiting. Patient not taking: Reported on 10/02/2018 02/27/16   Samuel Jester, DO    Family History Family History  Problem Relation Age of Onset  . COPD Other     Social History Social History   Tobacco Use  . Smoking status: Current Every Day Smoker    Packs/day: 1.00    Types: Cigarettes  . Smokeless tobacco: Never Used  Substance Use Topics  . Alcohol use: No  . Drug use: Yes    Types: Marijuana    Comment: today     Allergies   Patient has no known allergies.   Review of Systems Review of Systems  Constitutional: Positive for appetite change. Negative for activity change.       All ROS Neg except as noted in  HPI  HENT: Negative for nosebleeds.   Eyes: Negative for photophobia and discharge.  Respiratory: Negative for cough, shortness of breath and wheezing.   Cardiovascular: Negative for chest pain and palpitations.  Gastrointestinal: Positive for abdominal pain and vomiting. Negative for blood in stool.  Genitourinary: Negative for dysuria, frequency and hematuria.  Musculoskeletal: Negative for arthralgias, back pain and neck pain.  Skin: Negative.   Neurological: Negative for dizziness, seizures and speech difficulty.  Psychiatric/Behavioral: Negative for confusion and hallucinations.     Physical Exam Updated Vital Signs BP 123/72 (BP Location: Right Arm)   Pulse 73   Temp 97.7 F (36.5 C) (Oral)   Ht  (1.88 m)   Wt 74.8 kg   SpO2 100%   BMI 21.18 kg/m   Physical Exam Vitals signs and nursing note  reviewed.  Constitutional:      Appearance: He is well-developed. He is not toxic-appearing.  HENT:     Head: Normocephalic.     Right Ear: Tympanic membrane and external ear normal.     Left Ear: Tympanic membrane and external ear normal.  Eyes:     General: Lids are normal.     Pupils: Pupils are equal, round, and reactive to light.  Neck:     Musculoskeletal: Normal range of motion and neck supple.     Vascular: No carotid bruit.  Cardiovascular:     Rate and Rhythm: Normal rate and regular rhythm.     Pulses: Normal pulses.     Heart sounds: Normal heart sounds.  Pulmonary:     Effort: No respiratory distress.     Breath sounds: Normal breath sounds.  Abdominal:     General: Bowel sounds are normal.     Palpations: Abdomen is soft.     Tenderness: There is no abdominal tenderness. There is no right CVA tenderness, left CVA tenderness or guarding. Negative signs include Murphy's sign, McBurney's sign and psoas sign.  Musculoskeletal: Normal range of motion.  Lymphadenopathy:     Head:     Right side of head: No submandibular adenopathy.     Left side of head: No submandibular adenopathy.     Cervical: No cervical adenopathy.  Skin:    General: Skin is warm and dry.  Neurological:     Mental Status: He is alert and oriented to person, place, and time.     Cranial Nerves: No cranial nerve deficit.     Sensory: No sensory deficit.  Psychiatric:        Speech: Speech normal.      ED Treatments / Results  Labs (all labs ordered are listed, but only abnormal results are displayed) Labs Reviewed  COMPREHENSIVE METABOLIC PANEL - Abnormal; Notable for the following components:      Result Value   Glucose, Bld 100 (*)    All other components within normal limits  CBC WITH DIFFERENTIAL/PLATELET  LIPASE, BLOOD    EKG None  Radiology No results found.  Procedures Procedures (including critical care time)  Medications Ordered in ED Medications  famotidine  (PEPCID) tablet 20 mg (has no administration in time range)  pantoprazole (PROTONIX) EC tablet 40 mg (has no administration in time range)  ondansetron (ZOFRAN) tablet 4 mg (has no administration in time range)     Initial Impression / Assessment and Plan / ED Course  I have reviewed the triage vital signs and the nursing notes.  Pertinent labs & imaging results that were available during my care of the  patient were reviewed by me and considered in my medical decision making (see chart for details).          Final Clinical Impressions(s) / ED Diagnoses MDM  Patient reports 2 to 3-year history of abdominal problems.  He states he has been placed on antacids, but these have not been helping.  Patient states that he has pain almost every other day.  He has couple of episodes of vomiting almost every other day.  Patient states that this pain is kept him out of work, and he has to have a note to return back to work.  Complete blood count is well within normal limits. Lipase is normal at 44. Comprehensive metabolic panel is within normal limits.  Anion gap is normal at 8.  Vital signs are well within normal limits.  No emergent or surgical abdomen appreciated at this time.  The patient will be placed on antiacid, proton pump inhibitor, and anti-medic.  Patient will be referred to Ms. Boone for GI evaluation.   Final diagnoses:  Epigastric abdominal pain    ED Discharge Orders         Ordered    pantoprazole (PROTONIX) 20 MG tablet  Daily     10/02/18 1529    ondansetron (ZOFRAN) 4 MG tablet  Every 6 hours     10/02/18 1529    famotidine (PEPCID) 20 MG tablet  2 times daily     10/02/18 1529           Ivery QualeBryant, Sanvika Cuttino, PA-C 10/02/18 1556    Samuel JesterMcManus, Kathleen, [place]DO 10/06/18 [phone]

## 2018-10-03 ENCOUNTER — Telehealth: Payer: Self-pay | Admitting: Gastroenterology

## 2018-10-03 NOTE — Telephone Encounter (Signed)
Please arrange new patient appointment due to abdominal pain. Referred by ED over the weekend.

## 2018-10-04 ENCOUNTER — Encounter: Payer: Self-pay | Admitting: Gastroenterology

## 2018-10-04 NOTE — Telephone Encounter (Signed)
SCHEDULED AND LETTER SENT  °

## 2018-12-16 ENCOUNTER — Telehealth: Payer: Self-pay | Admitting: Gastroenterology

## 2018-12-16 ENCOUNTER — Encounter: Payer: Self-pay | Admitting: Gastroenterology

## 2018-12-16 ENCOUNTER — Ambulatory Visit: Payer: Medicaid Other | Admitting: Gastroenterology

## 2018-12-16 NOTE — Telephone Encounter (Signed)
PATIENT WAS A NO SHOW AND LETTER SENT  °
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
## 2020-05-05 ENCOUNTER — Other Ambulatory Visit: Payer: Self-pay

## 2020-05-05 ENCOUNTER — Encounter (HOSPITAL_COMMUNITY): Payer: Self-pay | Admitting: Emergency Medicine

## 2020-05-05 ENCOUNTER — Emergency Department (HOSPITAL_COMMUNITY)
Admission: EM | Admit: 2020-05-05 | Discharge: 2020-05-05 | Disposition: A | Discharge status: Home / Self Care | Payer: Medicaid Other | Attending: Emergency Medicine | Admitting: Emergency Medicine

## 2020-05-05 DIAGNOSIS — T2601XA Burn of right eyelid and periocular area, initial encounter: Secondary | ICD-10-CM | POA: Insufficient documentation

## 2020-05-05 DIAGNOSIS — Z23 Encounter for immunization: Secondary | ICD-10-CM | POA: Insufficient documentation

## 2020-05-05 DIAGNOSIS — T2024XA Burn of second degree of nose (septum), initial encounter: Secondary | ICD-10-CM | POA: Insufficient documentation

## 2020-05-05 DIAGNOSIS — X04XXXA Exposure to ignition of highly flammable material, initial encounter: Secondary | ICD-10-CM | POA: Insufficient documentation

## 2020-05-05 DIAGNOSIS — T300 Burn of unspecified body region, unspecified degree: Secondary | ICD-10-CM

## 2020-05-05 DIAGNOSIS — T310 Burns involving less than 10% of body surface: Secondary | ICD-10-CM | POA: Insufficient documentation

## 2020-05-05 DIAGNOSIS — F17210 Nicotine dependence, cigarettes, uncomplicated: Secondary | ICD-10-CM | POA: Insufficient documentation

## 2020-05-05 DIAGNOSIS — T2026XA Burn of second degree of forehead and cheek, initial encounter: Secondary | ICD-10-CM | POA: Insufficient documentation

## 2020-05-05 HISTORY — DX: Congenital malformation of heart, unspecified: Q24.9

## 2020-05-05 HISTORY — DX: Other disorders of lung: J98.4

## 2020-05-05 MED ORDER — HYDROCODONE-ACETAMINOPHEN 5-325 MG PO TABS: 1.0000 | ORAL_TABLET | Freq: Four times a day (QID) | ORAL | 0 refills | Status: DC | PRN

## 2020-05-05 MED ORDER — BACITRACIN-NEOMYCIN-POLYMYXIN 400-5-5000 EX OINT
TOPICAL_OINTMENT | Freq: Once | CUTANEOUS | Status: AC
  Administered 2020-05-05: 2 via TOPICAL
  Filled 2020-05-05: qty 2

## 2020-05-05 MED ORDER — TETANUS-DIPHTH-ACELL PERTUSSIS 5-2.5-18.5 LF-MCG/0.5 IM SUSY
0.5000 mL | PREFILLED_SYRINGE | Freq: Once | INTRAMUSCULAR | Status: AC
  Administered 2020-05-05: 0.5 mL via INTRAMUSCULAR
  Filled 2020-05-05: qty 0.5

## 2020-05-05 MED ORDER — OXYCODONE-ACETAMINOPHEN 5-325 MG PO TABS
1.0000 | ORAL_TABLET | Freq: Once | ORAL | Status: AC
  Administered 2020-05-05: 1 via ORAL
  Filled 2020-05-05: qty 1
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
## 2023-03-27 ENCOUNTER — Emergency Department (HOSPITAL_COMMUNITY): Payer: Self-pay

## 2023-03-27 ENCOUNTER — Other Ambulatory Visit: Payer: Self-pay

## 2023-03-27 ENCOUNTER — Encounter (HOSPITAL_COMMUNITY): Payer: Self-pay

## 2023-03-27 ENCOUNTER — Emergency Department (HOSPITAL_COMMUNITY)
Admission: EM | Admit: 2023-03-27 | Discharge: 2023-03-27 | Disposition: A | Discharge status: Home / Self Care | Payer: Self-pay | Attending: Emergency Medicine | Admitting: Emergency Medicine

## 2023-03-27 DIAGNOSIS — R1013 Epigastric pain: Secondary | ICD-10-CM | POA: Insufficient documentation

## 2023-03-27 DIAGNOSIS — R739 Hyperglycemia, unspecified: Secondary | ICD-10-CM | POA: Insufficient documentation

## 2023-03-27 DIAGNOSIS — R1012 Left upper quadrant pain: Secondary | ICD-10-CM | POA: Insufficient documentation

## 2023-03-27 DIAGNOSIS — F17210 Nicotine dependence, cigarettes, uncomplicated: Secondary | ICD-10-CM | POA: Insufficient documentation

## 2023-03-27 DIAGNOSIS — R748 Abnormal levels of other serum enzymes: Secondary | ICD-10-CM | POA: Insufficient documentation

## 2023-03-27 DIAGNOSIS — R1010 Upper abdominal pain, unspecified: Secondary | ICD-10-CM

## 2023-03-27 LAB — CBC
HCT: 42.9 % (ref 39.0–52.0)
Hemoglobin: 14.2 g/dL (ref 13.0–17.0)
MCH: 29.8 pg (ref 26.0–34.0)
MCHC: 33.1 g/dL (ref 30.0–36.0)
MCV: 90.1 fL (ref 80.0–100.0)
Platelets: 253 10*3/uL (ref 150–400)
RBC: 4.76 MIL/uL (ref 4.22–5.81)
RDW: 13.4 % (ref 11.5–15.5)
WBC: 10.2 10*3/uL (ref 4.0–10.5)
nRBC: 0 % (ref 0.0–0.2)

## 2023-03-27 LAB — URINALYSIS, ROUTINE W REFLEX MICROSCOPIC
Bilirubin Urine: NEGATIVE
Glucose, UA: NEGATIVE mg/dL
Hgb urine dipstick: NEGATIVE
Ketones, ur: NEGATIVE mg/dL
Leukocytes,Ua: NEGATIVE
Nitrite: NEGATIVE
Protein, ur: NEGATIVE mg/dL
Specific Gravity, Urine: 1.014 (ref 1.005–1.030)
pH: 7 (ref 5.0–8.0)

## 2023-03-27 LAB — COMPREHENSIVE METABOLIC PANEL
ALT: 26 U/L (ref 0–44)
AST: 20 U/L (ref 15–41)
Albumin: 4.4 g/dL (ref 3.5–5.0)
Alkaline Phosphatase: 74 U/L (ref 38–126)
Anion gap: 8 (ref 5–15)
BUN: 15 mg/dL (ref 6–20)
CO2: 25 mmol/L (ref 22–32)
Calcium: 9.2 mg/dL (ref 8.9–10.3)
Chloride: 104 mmol/L (ref 98–111)
Creatinine, Ser: 0.86 mg/dL (ref 0.61–1.24)
GFR, Estimated: >60 mL/min (ref >60)
Glucose, Bld: 112 mg/dL — ABNORMAL HIGH (ref 70–99)
Potassium: 4.1 mmol/L (ref 3.5–5.1)
Sodium: 137 mmol/L (ref 135–145)
Total Bilirubin: 0.5 mg/dL (ref <1.2)
Total Protein: 7 g/dL (ref 6.5–8.1)

## 2023-03-27 LAB — LIPASE, BLOOD: Lipase: 178 U/L — ABNORMAL HIGH (ref 11–51)

## 2023-03-27 LAB — TROPONIN I (HIGH SENSITIVITY): Troponin I (High Sensitivity): 2 ng/L (ref <18)

## 2023-03-27 LAB — D-DIMER, QUANTITATIVE: D-Dimer, Quant: <0.27 ug{FEU}/mL (ref 0.00–0.50)

## 2023-03-27 MED ORDER — IOHEXOL 300 MG/ML  SOLN
100.0000 mL | Freq: Once | INTRAMUSCULAR | Status: AC | PRN
  Administered 2023-03-27: 100 mL via INTRAVENOUS

## 2023-03-27 MED ORDER — HYDROCODONE-ACETAMINOPHEN 5-325 MG PO TABS: 1.0000 | ORAL_TABLET | ORAL | 0 refills | Status: AC | PRN

## 2023-03-27 MED ORDER — ALUM & MAG HYDROXIDE-SIMETH 200-200-20 MG/5ML PO SUSP
30.0000 mL | Freq: Once | ORAL | Status: AC
  Administered 2023-03-27: 30 mL via ORAL
  Filled 2023-03-27: qty 30

## 2023-03-27 MED ORDER — MORPHINE SULFATE (PF) 4 MG/ML IV SOLN
4.0000 mg | Freq: Once | INTRAVENOUS | Status: AC
  Administered 2023-03-27: 4 mg via INTRAVENOUS
  Filled 2023-03-27: qty 1

## 2023-03-27 NOTE — ED Notes (Signed)
Pt dc with nad. Ambulatory with steady gait.

## 2023-03-27 NOTE — ED Notes (Signed)
Received report from [facility]. Pt c/o pain going back up. Denies nausea. Nad.
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
## 2023-03-27 NOTE — ED Provider Notes (Signed)
[facility] EMERGENCY DEPARTMENT AT [facility] Provider Note   CSN: [id number] Arrival date & time: 03/27/23  4098     History  Chief Complaint  Patient presents with   Abdominal Pain    Jason Boyer is a 27 y.o. male.  The history is provided by the patient.  Abdominal Pain He complains of epigastric and left upper quadrant pain for the last 3 days.  Pain is sharp and constant without radiation.  He denies nausea, vomiting, diarrhea.  Pain is worse when he takes a very deep breath but he denies dyspnea.  He has tried taking acetaminophen without relief.  He is a cigarette smoker.  He has tried smoking some marijuana without any benefit.  He denies having had previous episodes like this.   Home Medications Prior to Admission medications   Medication Sig Start Date End Date Taking? Authorizing Provider  famotidine (PEPCID) 20 MG tablet Take 1 tablet (20 mg total) by mouth 2 (two) times daily. 10/02/18   Ivery Quale, PA-C  HYDROcodone-acetaminophen (NORCO/VICODIN) 5-325 MG tablet Take 1-2 tablets by mouth every 6 (six) hours as needed. 05/05/20   Maxwell Caul, PA-C  ibuprofen (ADVIL,MOTRIN) 800 MG tablet Take 1 tablet (800 mg total) by mouth 3 (three) times daily. Patient not taking: Reported on 10/02/2018 08/11/17   Triplett, Tammy, PA-C  ondansetron (ZOFRAN) 4 MG tablet Take 1 tablet (4 mg total) by mouth every 6 (six) hours. 10/02/18   Ivery Quale, PA-C  pantoprazole (PROTONIX) 20 MG tablet Take 1 tablet (20 mg total) by mouth daily. 10/02/18   Ivery Quale, PA-C  RaNITidine HCl (ZANTAC PO) Take 1 tablet by mouth once as needed (for heartburn/GERD symptoms).    [provider]      Allergies    Patient has no known allergies.    Review of Systems   Review of Systems  Gastrointestinal:  Positive for abdominal pain.  All other systems reviewed and are negative.   Physical Exam Updated Vital Signs BP 139/88 (BP Location: Left Arm)   Pulse (!) 51    Temp 98.1 F (36.7 C) (Oral)   Resp 16   Ht 6\' 2"  (1.88 m)   Wt 77.1 kg   SpO2 95%   BMI 21.82 kg/m  Physical Exam Vitals and nursing note reviewed.   27 year old male, resting comfortably and in no acute distress. Vital signs are significant for slightly slow heart rate. Oxygen saturation is 95%, which is normal. Head is normocephalic and atraumatic. PERRLA, EOMI.  Back is nontender and there is no CVA tenderness. Lungs are clear without rales, wheezes, or rhonchi. Chest is nontender. Heart has regular rate and rhythm without murmur. Abdomen is soft, flat, with mild epigastric and left upper quadrant tenderness.  There is no rebound or guarding. Extremities have no cyanosis or edema, full range of motion is present. Skin is warm and dry without rash. Neurologic: Mental status is normal, cranial nerves are intact, moves all extremities equally.  ED Results / Procedures / Treatments   Labs (all labs ordered are listed, but only abnormal results are displayed) Labs Reviewed  LIPASE, BLOOD  COMPREHENSIVE METABOLIC PANEL  CBC  URINALYSIS, ROUTINE W REFLEX MICROSCOPIC  D-DIMER, QUANTITATIVE  TROPONIN I (HIGH SENSITIVITY)    EKG None  Radiology No results found.  Procedures Procedures    Medications Ordered in ED Medications - No data to display  ED Course/ Medical Decision Making/ A&P  Medical Decision Making Amount and/or Complexity of Data Reviewed Labs: ordered. Radiology: ordered.  Risk OTC drugs. Prescription drug management.   Epigastric and left upper quadrant pain.  Differential diagnosis includes, but is not limited to, peptic ulcer disease, GERD, diverticulitis, ACS, pulmonary embolism.  I have ordered laboratory workup of CBC, comprehensive metabolic panel, lipase, troponin x 1, D-dimer.  I have ordered an electrocardiogram.  I have ordered a therapeutic trial of an antiacid.  He had no relief of pain with antiacid.   I reviewed his laboratory test, my interpretation is normal troponin, normal D-dimer, elevated random glucose level, moderately elevated lipase, normal CBC, normal urinalysis.  At this point, I am suspicious that his pain is from pancreatitis.  I asked him about alcohol consumption, and he adamantly denies ethanol use.  I have ordered CT of abdomen and pelvis to look for further evidence of pancreatitis.  I have ordered a dose of morphine for pain.   He had moderate leaf of pain with a single dose of morphine but he seems to be resting comfortably.  CT scan shows normal pancreas but possible edema at the porta hepatis.  I have independently reviewed the images, and agree with radiologist's interpretation but question whether there might also be some edema of the head of the pancreas.  I am discharging him with a prescription for small number of hydrocodone-acetaminophen tablets, I am referring him to gastroenterology for follow-up.  Return precautions have been discussed.  Final Clinical Impression(s) / ED Diagnoses Final diagnoses:  Upper abdominal pain  Elevated lipase  Elevated random blood glucose level    Rx / DC Orders ED Discharge Orders          Ordered    HYDROcodone-acetaminophen (NORCO/VICODIN) 5-325 MG tablet  Every 4 hours PRN        03/27/23 0723              Dione Booze, MD 03/27/23 0725

## 2023-03-27 NOTE — ED Triage Notes (Signed)
Pt c/o abdominal pain x3 days that is located towards left side and radiates to upper middle chest. Endorses lose stools but no other complaints. No N/V.

## 2023-12-07 ENCOUNTER — Emergency Department (HOSPITAL_COMMUNITY): Payer: Self-pay

## 2023-12-07 ENCOUNTER — Encounter (HOSPITAL_COMMUNITY): Payer: Self-pay | Admitting: *Deleted

## 2023-12-07 ENCOUNTER — Emergency Department (HOSPITAL_COMMUNITY)
Admission: EM | Admit: 2023-12-07 | Discharge: 2023-12-07 | Disposition: A | Discharge status: Home / Self Care | Payer: Self-pay | Attending: Emergency Medicine | Admitting: Emergency Medicine

## 2023-12-07 ENCOUNTER — Other Ambulatory Visit: Payer: Self-pay

## 2023-12-07 DIAGNOSIS — X500XXA Overexertion from strenuous movement or load, initial encounter: Secondary | ICD-10-CM | POA: Insufficient documentation

## 2023-12-07 DIAGNOSIS — M62830 Muscle spasm of back: Secondary | ICD-10-CM | POA: Insufficient documentation

## 2023-12-07 MED ORDER — CYCLOBENZAPRINE HCL 10 MG PO TABS: 10.0000 mg | ORAL_TABLET | Freq: Two times a day (BID) | ORAL | 0 refills | Status: AC | PRN

## 2023-12-07 MED ORDER — IBUPROFEN 400 MG PO TABS
600.0000 mg | ORAL_TABLET | Freq: Once | ORAL | Status: AC
  Administered 2023-12-07: 600 mg via ORAL
  Filled 2023-12-07: qty 2

## 2023-12-07 MED ORDER — METHOCARBAMOL 500 MG PO TABS
1000.0000 mg | ORAL_TABLET | Freq: Once | ORAL | Status: AC
  Administered 2023-12-07: 1000 mg via ORAL
  Filled 2023-12-07: qty 2

## 2023-12-07 NOTE — Discharge Instructions (Signed)
 As we discussed, plan to rest the muscles of the back as much as possible by lying and allowing the muscle to be at complete rest. Warm compresses as instruction. Take Flexeril  twice daily for muscle spasm and ibuprofen  600 mg which is 3 of the over-the-counter strength tablets, every 6 hours to reduce inflammation. See you doctor if symptoms change or worsen.

## 2023-12-07 NOTE — ED Triage Notes (Signed)
 Pt picked up a mower and went down to put mower back down and felt something pop in his back.  Pain states mid back that radiates down. Pt states pain with deep breathes. Pt denies any numbness/tingling or loss of control of urine or bowel.

## 2023-12-07 NOTE — ED Provider Notes (Signed)
 [place] EMERGENCY DEPARTMENT AT [facility] Provider Note   CSN: [id number] Arrival date & time: 12/07/23  1359     Patient presents with: Back Pain   Jason Boyer is a 28 y.o. male.   Patient to ED with c/o back pain after lifting a heavy object yesterday. He states onset of pain when he went to lower the object back to the ground. He felt a sudden, sharp pain n the L>R thoracic back. He states this morning on waking the pain was significantly worse causing nausea with vomiting x 1. Pain has improved with movement and activity since this morning, no further nausea. No numbness, weakness.   The history is provided by the patient. No language interpreter was used.  Back Pain      Prior to Admission medications   Medication Sig Start Date End Date Taking? Authorizing Provider  cyclobenzaprine  (FLEXERIL ) 10 MG tablet Take 1 tablet (10 mg total) by mouth 2 (two) times daily as needed for muscle spasms. 12/07/23  Yes Shauntae Reitman, Margit, PA-C  famotidine  (PEPCID ) 20 MG tablet Take 1 tablet (20 mg total) by mouth 2 (two) times daily. 10/02/18   Armida Culver, PA-C  HYDROcodone -acetaminophen  (NORCO/VICODIN) 5-325 MG tablet Take 1 tablet by mouth every 4 (four) hours as needed for moderate pain (pain score 4-6). 03/27/23   Raford Lenis, MD  ibuprofen  (ADVIL ,MOTRIN ) 800 MG tablet Take 1 tablet (800 mg total) by mouth 3 (three) times daily. Patient not taking: Reported on 10/02/2018 08/11/17   Triplett, Tammy, PA-C  ondansetron  (ZOFRAN ) 4 MG tablet Take 1 tablet (4 mg total) by mouth every 6 (six) hours. 10/02/18   Armida Culver, PA-C  pantoprazole  (PROTONIX ) 20 MG tablet Take 1 tablet (20 mg total) by mouth daily. 10/02/18   Armida Culver, PA-C  RaNITidine HCl (ZANTAC PO) Take 1 tablet by mouth once as needed (for heartburn/GERD symptoms).    [provider]    Allergies: Patient has no known allergies.    Review of Systems  Musculoskeletal:  Positive for back pain.     Updated Vital Signs BP 122/77 (BP Location: Right Arm)   Pulse 88   Temp 98.4 F (36.9 C) (Oral)   Resp 17   Ht 6' 2 (1.88 m)   Wt 77.1 kg   SpO2 99%   BMI 21.83 kg/m   Physical Exam Vitals and nursing note reviewed.  Constitutional:      Appearance: Normal appearance. He is well-developed.  Pulmonary:     Effort: Pulmonary effort is normal.  Musculoskeletal:        General: Normal range of motion.     Cervical back: Normal range of motion.       Back:     Comments: Tender to left parathoracic back without swelling or discoloration. Minimal midline or right parathoracic tenderness. FROM all extremities. Ambulatory without limitation.   Skin:    General: Skin is warm and dry.  Neurological:     Mental Status: He is alert and oriented to person, place, and time.     (all labs ordered are listed, but only abnormal results are displayed) Labs Reviewed - No data to display  EKG: None  Radiology: DG Thoracic Spine 2 View Result Date: 12/07/2023 CLINICAL DATA:  Back pain after lifting injury. EXAM: THORACIC SPINE 2 VIEWS COMPARISON:  None Available. FINDINGS: The alignment is maintained. Vertebral body heights are maintained. No evidence of acute fracture. No significant disc space narrowing. Posterior elements appear intact. There is  no paravertebral soft tissue abnormality. IMPRESSION: Negative radiographs of the thoracic spine. Electronically Signed   By: Andrea Gasman M.D.   On: 12/07/2023 16:18     Procedures   Medications Ordered in the ED  methocarbamol  (ROBAXIN ) tablet 1,000 mg (1,000 mg Oral Given 12/07/23 1540)  ibuprofen  (ADVIL ) tablet 600 mg (600 mg Oral Given 12/07/23 1540)    Clinical Course as of 12/07/23 2308  Mon Dec 07, 2023  2307 Patient with mid-back pain since heavy lifting yesterday. No neurologic deficits. T-spine plain film negative. Feel pain is related to muscle spasm. Will provide supportive care.  [SU]    Clinical Course User Index [SU]  Odell Balls, PA-C                                 Medical Decision Making Amount and/or Complexity of Data Reviewed Radiology: ordered.  Risk Prescription drug management.        Final diagnoses:  Muscle spasm of back    ED Discharge Orders          Ordered    cyclobenzaprine  (FLEXERIL ) 10 MG tablet  2 times daily PRN        12/07/23 1658               Odell Balls, PA-C 12/07/23 2308    Yolande Lamar BROCKS, MD 12/09/23 [phone]
